# Patient Record
Sex: Female | Born: 1965 | Race: White | Hispanic: No | Marital: Single | Smoking: Former smoker
Health system: Southern US, Community
[De-identification: ages and names within clinical notes are randomized; demographics above are authoritative.]

## PROBLEM LIST (undated history)

## (undated) DIAGNOSIS — I252 Old myocardial infarction: Secondary | ICD-10-CM

## (undated) DIAGNOSIS — E119 Type 2 diabetes mellitus without complications: Secondary | ICD-10-CM

## (undated) DIAGNOSIS — I2510 Atherosclerotic heart disease of native coronary artery without angina pectoris: Secondary | ICD-10-CM

## (undated) HISTORY — PX: CARDIAC SURGERY: SHX584

---

## 2014-12-31 ENCOUNTER — Ambulatory Visit
Admission: EM | Admit: 2014-12-31 | Discharge: 2014-12-31 | Disposition: A | Discharge status: Home / Self Care | Payer: 59 | Attending: Family Medicine | Admitting: Family Medicine

## 2014-12-31 ENCOUNTER — Encounter: Payer: Self-pay | Admitting: Emergency Medicine

## 2014-12-31 DIAGNOSIS — B373 Candidiasis of vulva and vagina: Secondary | ICD-10-CM

## 2014-12-31 DIAGNOSIS — N390 Urinary tract infection, site not specified: Secondary | ICD-10-CM | POA: Diagnosis not present

## 2014-12-31 DIAGNOSIS — B3731 Acute candidiasis of vulva and vagina: Secondary | ICD-10-CM

## 2014-12-31 HISTORY — DX: Atherosclerotic heart disease of native coronary artery without angina pectoris: I25.10

## 2014-12-31 LAB — URINALYSIS COMPLETE WITH MICROSCOPIC (ARMC ONLY): SQUAMOUS EPITHELIAL / LPF: NONE SEEN

## 2014-12-31 MED ORDER — TERCONAZOLE 0.8 % VA CREA: 1.0000 | TOPICAL_CREAM | Freq: Every day | VAGINAL | Status: AC

## 2014-12-31 MED ORDER — FLUCONAZOLE 150 MG PO TABS: 150.0000 mg | ORAL_TABLET | Freq: Every day | ORAL | Status: AC

## 2014-12-31 MED ORDER — KETOCONAZOLE 2 % EX CREA: 1.0000 "application " | TOPICAL_CREAM | Freq: Every day | CUTANEOUS | Status: AC

## 2014-12-31 MED ORDER — SULFAMETHOXAZOLE-TRIMETHOPRIM 800-160 MG PO TABS: 1.0000 | ORAL_TABLET | Freq: Two times a day (BID) | ORAL | Status: AC

## 2014-12-31 NOTE — Discharge Instructions (Signed)

## 2014-12-31 NOTE — ED Notes (Signed)
Patient states she feels like she has a "yeast infection and/or UTI" Has been taking AZO

## 2014-12-31 NOTE — ED Provider Notes (Signed)
CSN: [id number]     Arrival date & time 12/31/14  1050 History   First MD Initiated Contact with Patient 12/31/14 1328     Chief Complaint  Patient presents with  . Urinary Tract Infection   (Consider location/radiation/quality/duration/timing/severity/associated sxs/prior Treatment) HPI   49 year old female diabetic insulin-dependent who presents with symptoms of a UTI and yeast infection. She states that she has not been watching her diet has been drinking and eating to excess with her blood sugars out of control. She has the urgency frequency dysuria and now feels that she is "raw on her bottom"she has frequent occurrences of East infection and is certain that is what this is. She usually has Diflucan ordered and a cream for external use. [facility] but was told they were booked solid and referred her to us..  Past Medical History  Diagnosis Date  . Coronary artery disease    Past Surgical History  Procedure Laterality Date  . Cardiac surgery     History reviewed. No pertinent family history. Social History  Substance Use Topics  . Smoking status: Former Games developermoker  . Smokeless tobacco: None  . Alcohol Use: Yes   OB History    No data available     Review of Systems  Constitutional: Negative for fever, chills and fatigue.  Genitourinary: Positive for dysuria, urgency, frequency, vaginal discharge and vaginal pain.  Skin: Positive for rash.  All other systems reviewed and are negative.   Allergies  Review of patient's allergies indicates not on file.  Home Medications   Prior to Admission medications   Medication Sig Start Date End Date Taking? Authorizing Provider  gabapentin (NEURONTIN) 100 MG capsule Take 100 mg by mouth 3 (three) times daily.   Yes Historical Provider, MD  insulin detemir (LEVEMIR) 100 UNIT/ML injection Inject into the skin at bedtime.   Yes Historical Provider, MD  insulin lispro protamine-lispro (HUMALOG MIX  50/50) (50-50) 100 UNIT/ML SUSP injection Inject into the skin 2 (two) times daily before a meal.   Yes Historical Provider, MD  metFORMIN (GLUCOPHAGE) 1000 MG tablet Take 1,000 mg by mouth 2 (two) times daily with a meal.   Yes Historical Provider, MD  naproxen (NAPROSYN) 250 MG tablet Take by mouth 2 (two) times daily with a meal.   Yes Historical Provider, MD  fluconazole (DIFLUCAN) 150 MG tablet Take 1 tablet (150 mg total) by mouth daily. 12/31/14   Lutricia FeilWilliam P Inari Shin, PA-C  ketoconazole (NIZORAL) 2 % cream Apply 1 application topically daily. 12/31/14   Lutricia FeilWilliam P Analiza Cowger, PA-C  sulfamethoxazole-trimethoprim (BACTRIM DS,SEPTRA DS) 800-160 MG tablet Take 1 tablet by mouth 2 (two) times daily. 12/31/14   Lutricia FeilWilliam P Colston Pyle, PA-C  terconazole (TERAZOL 3) 0.8 % vaginal cream Place 1 applicator vaginally at bedtime. 12/31/14   Lutricia FeilWilliam P Meilah Delrosario, PA-C   Meds Ordered and Administered this Visit  Medications - No data to display  BP 115/73 mmHg  Pulse 100  Temp(Src) 97.3 F (36.3 C) (Tympanic)  Resp 18  Ht 5\' 5"  (1.651 m)  Wt 180 lb (81.647 kg)  BMI 29.95 kg/m2  SpO2 97% No data found.   Physical Exam  Constitutional: She is oriented to person, place, and time. She appears well-developed and well-nourished. No distress.  HENT:  Head: Normocephalic and atraumatic.  Eyes: Conjunctivae are normal. Pupils are equal, round, and reactive to light.  Neck: Normal range of motion. Neck supple.  Pulmonary/Chest: Effort normal and breath  sounds normal. No respiratory distress. She has no wheezes. She has no rales.  Abdominal: Soft. Bowel sounds are normal. She exhibits no distension. There is no tenderness. There is no rebound and no guarding.  No CVA  tenderness is present  Genitourinary:  Pelvic is deferred at patient's request  Musculoskeletal: Normal range of motion. She exhibits no edema or tenderness.  Neurological: She is alert and oriented to person, place, and time.  Skin: Skin is warm and  dry. She is not diaphoretic.  Psychiatric: She has a normal mood and affect. Her behavior is normal. Judgment and thought content normal.  Nursing note and vitals reviewed.   ED Course  Procedures (including critical care time)  Labs Review Labs Reviewed  URINALYSIS COMPLETEWITH MICROSCOPIC ([facility] ONLY) - Abnormal; Notable for the following:    Color, Urine ORANGE (*)    APPearance CLOUDY (*)    Glucose, UA   (*)    Value: TEST NOT REPORTED DUE TO COLOR INTERFERENCE OF URINE PIGMENT   Bilirubin Urine   (*)    Value: TEST NOT REPORTED DUE TO COLOR INTERFERENCE OF URINE PIGMENT   Ketones, ur   (*)    Value: TEST NOT REPORTED DUE TO COLOR INTERFERENCE OF URINE PIGMENT   Hgb urine dipstick   (*)    Value: TEST NOT REPORTED DUE TO COLOR INTERFERENCE OF URINE PIGMENT   Protein, ur   (*)    Value: TEST NOT REPORTED DUE TO COLOR INTERFERENCE OF URINE PIGMENT   Nitrite   (*)    Value: TEST NOT REPORTED DUE TO COLOR INTERFERENCE OF URINE PIGMENT   Leukocytes, UA   (*)    Value: TEST NOT REPORTED DUE TO COLOR INTERFERENCE OF URINE PIGMENT   Bacteria, UA MANY (*)    All other components within normal limits  URINE CULTURE    Imaging Review No results found.   Visual Acuity Review  Right Eye Distance:   Left Eye Distance:   Bilateral Distance:    Right Eye Near:   Left Eye Near:    Bilateral Near:         MDM   1. UTI (lower urinary tract infection)   2. Candidal vaginitis    Discharge Medication List as of 12/31/2014  1:49 PM    START taking these medications   Details  ketoconazole (NIZORAL) 2 % cream Apply 1 application topically daily., Starting 12/31/2014, Until Discontinued, Print    sulfamethoxazole-trimethoprim (BACTRIM DS,SEPTRA DS) 800-160 MG tablet Take 1 tablet by mouth 2 (two) times daily., Starting 12/31/2014, Until Discontinued, Print    terconazole (TERAZOL 3) 0.8 % vaginal cream Place 1 applicator vaginally at bedtime., Starting 12/31/2014, Until  Discontinued, Print      Plan: 1. Test/x-ray results and diagnosis reviewed with patient 2. rx as per orders; risks, benefits, potential side effects reviewed with patient 3. Recommend supportive treatment with fluids and rest. She is to watch her diet more closely. She will call in 48 hours for confirmation of the C&S results. I will treat her empirically for her vaginitis from Candida. Follow-up with her primary care physician if she is not improving. 4. F/u prn if symptoms worsen or don't improve     Lutricia Feil, PA-C 12/31/14 1403

## 2015-01-02 LAB — URINE CULTURE
Culture: >=100000
Special Requests: NORMAL

## 2015-03-05 ENCOUNTER — Ambulatory Visit (INDEPENDENT_AMBULATORY_CARE_PROVIDER_SITE_OTHER): Payer: Worker's Compensation

## 2015-03-05 ENCOUNTER — Encounter: Payer: Self-pay | Admitting: Gynecology

## 2015-03-05 ENCOUNTER — Ambulatory Visit
Admission: EM | Admit: 2015-03-05 | Discharge: 2015-03-05 | Disposition: A | Discharge status: Home / Self Care | Payer: Worker's Compensation | Attending: Family Medicine | Admitting: Family Medicine

## 2015-03-05 DIAGNOSIS — S60221A Contusion of right hand, initial encounter: Secondary | ICD-10-CM

## 2015-03-05 MED ORDER — KETOROLAC TROMETHAMINE 60 MG/2ML IM SOLN
60.0000 mg | Freq: Once | INTRAMUSCULAR | Status: AC
  Administered 2015-03-05: 60 mg via INTRAMUSCULAR

## 2015-03-05 MED ORDER — OXYCODONE-ACETAMINOPHEN 5-325 MG PO TABS: 1.0000 | ORAL_TABLET | Freq: Three times a day (TID) | ORAL | Status: AC | PRN

## 2015-03-05 MED ORDER — MELOXICAM 15 MG PO TABS: 15.0000 mg | ORAL_TABLET | Freq: Every day | ORAL | Status: AC

## 2015-03-05 NOTE — ED Notes (Signed)
Patient stated x today while reaching for a tube at work metal plate fell and crushed on her right hand.

## 2015-03-05 NOTE — ED Provider Notes (Signed)
CSN: [id number]     Arrival date & time 03/05/15  1140 History   First MD Initiated Contact with Patient 03/05/15 1331    Nurses notes were reviewed. Chief Complaint  Patient presents with  . Work Related Injury  patient reports about 2 hours ago having hydraulic machine crushed her right hand. States that [place] 9 on 2 sprays concern. Denies any previous fracture or injury to the right hand before this. She has abrasion right hand last tetanus was about 2 years ago according to her. Past history she's had a history coronary artery disease with heart bypass surgery she used to smoke but stopped after she had myocardial infarction. She is a diabetic as well.    (Consider location/radiation/quality/duration/timing/severity/associated sxs/prior Treatment) Patient is a 50 y.o. female presenting with hand pain. The history is provided by the patient. No language interpreter was used.  Hand Pain This is a new problem. The current episode started 1 to 2 hours ago. The problem occurs constantly. The problem has been gradually worsening. Associated symptoms include chest pain. The symptoms are aggravated by twisting. Nothing relieves the symptoms. She has tried a cold compress for the symptoms. The treatment provided no relief.    Past Medical History  Diagnosis Date  . Coronary artery disease    Past Surgical History  Procedure Laterality Date  . Cardiac surgery     History reviewed. No pertinent family history. Social History  Substance Use Topics  . Smoking status: Former Games developer  . Smokeless tobacco: None  . Alcohol Use: Yes   OB History    No data available     Review of Systems  Cardiovascular: Positive for chest pain.  Skin: Negative for color change.  All other systems reviewed and are negative.   Allergies  Review of patient's allergies indicates no known allergies.  Home Medications   Prior to Admission medications   Medication Sig Start Date End Date Taking?  Authorizing Provider  acetaminophen (TYLENOL) 500 MG tablet Take 500 mg by mouth every 6 (six) hours as needed.   Yes Historical Provider, MD  aspirin 81 MG tablet Take 81 mg by mouth daily.   Yes Historical Provider, MD  cyclobenzaprine (FLEXERIL) 10 MG tablet Take 10 mg by mouth 3 (three) times daily as needed for muscle spasms.   Yes Historical Provider, MD  gabapentin (NEURONTIN) 100 MG capsule Take 100 mg by mouth 3 (three) times daily.   Yes Historical Provider, MD  insulin detemir (LEVEMIR) 100 UNIT/ML injection Inject into the skin at bedtime.   Yes Historical Provider, MD  insulin lispro protamine-lispro (HUMALOG MIX 50/50) (50-50) 100 UNIT/ML SUSP injection Inject into the skin 2 (two) times daily before a meal.   Yes Historical Provider, MD  metFORMIN (GLUCOPHAGE) 1000 MG tablet Take 1,000 mg by mouth 2 (two) times daily with a meal.   Yes Historical Provider, MD  Multiple Vitamin (MULTIVITAMIN) capsule Take 1 capsule by mouth daily.   Yes Historical Provider, MD  naproxen (NAPROSYN) 250 MG tablet Take by mouth 2 (two) times daily with a meal.   Yes Historical Provider, MD  fluconazole (DIFLUCAN) 150 MG tablet Take 1 tablet (150 mg total) by mouth daily. 12/31/14   Lutricia Feil, PA-C  ketoconazole (NIZORAL) 2 % cream Apply 1 application topically daily. 12/31/14   Lutricia Feil, PA-C  meloxicam (MOBIC) 15 MG tablet Take 1 tablet (15 mg total) by mouth daily. 03/05/15   Hassan Rowan, MD  oxyCODONE-acetaminophen (PERCOCET/ROXICET) 5-325 MG  tablet Take 1-2 tablets by mouth every 8 (eight) hours as needed for severe pain. 03/05/15   Hassan Rowan, MD  sulfamethoxazole-trimethoprim (BACTRIM DS,SEPTRA DS) 800-160 MG tablet Take 1 tablet by mouth 2 (two) times daily. 12/31/14   Lutricia Feil, PA-C  terconazole (TERAZOL 3) 0.8 % vaginal cream Place 1 applicator vaginally at bedtime. 12/31/14   Lutricia Feil, PA-C   Meds Ordered and Administered this Visit   Medications  ketorolac  (TORADOL) injection 60 mg (60 mg Intramuscular Given 03/05/15 1346)    BP 153/81 mmHg  Pulse 89  Temp(Src) 97.9 F (36.6 C) (Oral)  Resp 18  Ht  (1.651 m)  Wt 180 lb (81.647 kg)  BMI 29.95 kg/m2  SpO2 100%  LMP  No data found.   Physical Exam  Constitutional: She is oriented to person, place, and time. She appears well-developed and well-nourished.  HENT:  Head: Normocephalic and atraumatic.  Eyes: Conjunctivae are normal. Pupils are equal, round, and reactive to light.  Neck: Normal range of motion. Neck supple. No tracheal deviation present.  Cardiovascular: Normal rate.   Musculoskeletal: She exhibits edema and tenderness.       Right hand: She exhibits decreased range of motion, tenderness, bony tenderness and laceration.       Hands: Patient's inability to make a fist right hand right hand is swollen and warm to touch. Is a small abrasion on the dorsum of the right hand as well.  Neurological: She is alert and oriented to person, place, and time. She has normal reflexes. No cranial nerve deficit.  Skin: Skin is warm. No rash noted. No erythema.  Vitals reviewed.   ED Course  Procedures (including critical care time)  Labs Review Labs Reviewed - No data to display  Imaging Review Dg Hand Complete Right  03/05/2015  CLINICAL DATA:  Pain crawling machine compressed hand causing crush type injury EXAM: RIGHT HAND - COMPLETE 3+ VIEW COMPARISON:  None. FINDINGS: Frontal, oblique, and lateral views were obtained. There is soft tissue swelling. There appears to be air tracking in the soft tissues of the dorsal aspect of the hand. Soft tissue swelling is rather marked in the mid to distal second digit. No fracture or dislocation is evident. The joint spaces appear normal. No erosive change. IMPRESSION: No demonstrable fracture or dislocation. No appreciable joint space narrowing. There is soft tissue swelling somewhat diffusely, most notably in the mid to distal second digit.  There is apparent soft tissue air tracking in the dorsum of the hand consistent with soft tissue injury. No radiopaque foreign body identified. Electronically Signed   By: Bretta Bang III M.D.   On: 03/05/2015 14:00     Visual Acuity Review  Right Eye Distance:   Left Eye Distance:   Bilateral Distance:    Right Eye Near:   Left Eye Near:    Bilateral Near:         MDM   1. Hand contusion, right, initial encounter     Will administer Toradol 60 mg IM to help the pain. X-ray of the right hand as well breakfast continue placing ice on the right hand also. We'll plan for Mobic 15 mg 1 tablet day for 10 pain and narcotic if any fractures present and may have to start conservatively the tramadol Vicodin just because of marked swelling that she's having.  Discussed with patient x-ray of the right hand was negative but did show marked soft tissue swelling. Recommend ice of the  right hand until Saturday as much as she can. Will Allow her to go back to work tomorrow but no use the right hand at all. We'll provide her a right cockup wrist splint, will place on Mobic 15 mg and will give her a prescription for Percocet tablets for pain as well. Informed her to contact  Kela Millin for follow-up early next week.     Hassan Rowan, MD 03/05/15 [phone]

## 2015-03-05 NOTE — Discharge Instructions (Signed)
Contusion °A contusion is a deep bruise. Contusions happen when an injury causes bleeding under the skin. Symptoms of bruising include pain, swelling, and discolored skin. The skin may turn blue, purple, or yellow. °HOME CARE  °· Rest the injured area. °· If told, put ice on the injured area. °¨ Put ice in a plastic bag. °¨ Place a towel between your skin and the bag. °¨ Leave the ice on for 20 minutes, 2-3 times per day. °· If told, put light pressure (compression) on the injured area using an elastic bandage. Make sure the bandage is not too tight. Remove it and put it back on as told by your doctor. °· If possible, raise (elevate) the injured area above the level of your heart while you are sitting or lying down. °· Take over-the-counter and prescription medicines only as told by your doctor. °GET HELP IF: °· Your symptoms do not get better after several days of treatment. °· Your symptoms get worse. °· You have trouble moving the injured area. °GET HELP RIGHT AWAY IF:  °· You have very bad pain. °· You have a loss of feeling (numbness) in a hand or foot. °· Your hand or foot turns pale or cold. °  °This information is not intended to replace advice given to you by your health care provider. Make sure you discuss any questions you have with your health care provider. °  °Document Released: 06/09/2007 Document Revised: 09/11/2014 Document Reviewed: 05/08/2014 °Elsevier Interactive Patient Education ©2016 [employer] ° °Hand Contusion ° A hand contusion is a deep bruise to the hand. Contusions happen when an injury causes bleeding under the skin. Signs of bruising include pain, puffiness (swelling), and discolored skin. The contusion may turn blue, purple, or yellow. °HOME CARE °· Put ice on the injured area. °¨ Put ice in a plastic bag. °¨ Place a towel between your skin and the bag. °¨ Leave the ice on for 15-20 minutes, 03-04 times a day. °· Only take medicines as told by your doctor. °· Use an elastic wrap  only as told. You may remove the wrap for sleeping, showering, and bathing. Take the wrap off if you lose feeling (have numbness) in your fingers, or they turn blue or cold. Put the wrap on more loosely. °· Keep the hand raised (elevated) with pillows. °· Avoid using your hand too much if it is painful. °GET HELP RIGHT AWAY IF:  °· You have more redness, puffiness, or pain in your hand. °· Your puffiness or pain does not get better with medicine. °· You lose feeling in your hand, or you cannot move your fingers. °· Your hand turns cold or blue. °· You have pain when you move your fingers. °· Your hand feels warm. °· Your contusion does not get better in 2 days. °MAKE SURE YOU:  °· Understand these instructions. °· Will watch this condition. °· Will get help right away if you are not doing well or you get worse. °  °This information is not intended to replace advice given to you by your health care provider. Make sure you discuss any questions you have with your health care provider. °  °Document Released: 06/09/2007 Document Revised: 01/11/2014 Document Reviewed: 06/14/2011 °Elsevier Interactive Patient Education ©2016 [employer] ° °

## 2016-03-29 ENCOUNTER — Emergency Department
Admission: EM | Admit: 2016-03-29 | Discharge: 2016-03-29 | Disposition: A | Discharge status: Home / Self Care | Payer: 59 | Attending: Emergency Medicine | Admitting: Emergency Medicine

## 2016-03-29 ENCOUNTER — Encounter: Payer: Self-pay | Admitting: Emergency Medicine

## 2016-03-29 DIAGNOSIS — Z87891 Personal history of nicotine dependence: Secondary | ICD-10-CM | POA: Diagnosis not present

## 2016-03-29 DIAGNOSIS — E162 Hypoglycemia, unspecified: Secondary | ICD-10-CM

## 2016-03-29 DIAGNOSIS — I2510 Atherosclerotic heart disease of native coronary artery without angina pectoris: Secondary | ICD-10-CM | POA: Diagnosis not present

## 2016-03-29 DIAGNOSIS — Z794 Long term (current) use of insulin: Secondary | ICD-10-CM | POA: Insufficient documentation

## 2016-03-29 DIAGNOSIS — R4182 Altered mental status, unspecified: Secondary | ICD-10-CM | POA: Diagnosis present

## 2016-03-29 DIAGNOSIS — Z79899 Other long term (current) drug therapy: Secondary | ICD-10-CM | POA: Insufficient documentation

## 2016-03-29 DIAGNOSIS — Z7982 Long term (current) use of aspirin: Secondary | ICD-10-CM | POA: Insufficient documentation

## 2016-03-29 DIAGNOSIS — E11649 Type 2 diabetes mellitus with hypoglycemia without coma: Secondary | ICD-10-CM | POA: Insufficient documentation

## 2016-03-29 DIAGNOSIS — I252 Old myocardial infarction: Secondary | ICD-10-CM | POA: Insufficient documentation

## 2016-03-29 HISTORY — DX: Old myocardial infarction: I25.2

## 2016-03-29 HISTORY — DX: Type 2 diabetes mellitus without complications: E11.9

## 2016-03-29 LAB — URINALYSIS, COMPLETE (UACMP) WITH MICROSCOPIC
Bacteria, UA: NONE SEEN
Bilirubin Urine: NEGATIVE
Glucose, UA: >=500 mg/dL — AB
Hgb urine dipstick: NEGATIVE
Ketones, ur: NEGATIVE mg/dL
Leukocytes, UA: NEGATIVE
Nitrite: NEGATIVE
Protein, ur: NEGATIVE mg/dL
Specific Gravity, Urine: 1.003 — ABNORMAL LOW (ref 1.005–1.030)
WBC, UA: NONE SEEN WBC/hpf (ref 0–5)
pH: 8 (ref 5.0–8.0)

## 2016-03-29 LAB — BASIC METABOLIC PANEL WITH GFR
Anion gap: 6 (ref 5–15)
BUN: 20 mg/dL (ref 6–20)
CO2: 24 mmol/L (ref 22–32)
Calcium: 9.4 mg/dL (ref 8.9–10.3)
Chloride: 109 mmol/L (ref 101–111)
Creatinine, Ser: 0.83 mg/dL (ref 0.44–1.00)
GFR calc Af Amer: >60 mL/min (ref >60)
GFR calc non Af Amer: >60 mL/min (ref >60)
Glucose, Bld: 267 mg/dL — ABNORMAL HIGH (ref 65–99)
Potassium: 4 mmol/L (ref 3.5–5.1)
Sodium: 139 mmol/L (ref 135–145)

## 2016-03-29 LAB — GLUCOSE, CAPILLARY
Glucose-Capillary: 211 mg/dL — ABNORMAL HIGH (ref 65–99)
Glucose-Capillary: 238 mg/dL — ABNORMAL HIGH (ref 65–99)
Glucose-Capillary: 259 mg/dL — ABNORMAL HIGH (ref 65–99)
Glucose-Capillary: 279 mg/dL — ABNORMAL HIGH (ref 65–99)
Glucose-Capillary: 295 mg/dL — ABNORMAL HIGH (ref 65–99)

## 2016-03-29 LAB — CBC
HEMATOCRIT: 36.1 % (ref 35.0–47.0)
Hemoglobin: 12.4 g/dL (ref 12.0–16.0)
MCH: 32.1 pg (ref 26.0–34.0)
MCHC: 34.4 g/dL (ref 32.0–36.0)
MCV: 93.4 fL (ref 80.0–100.0)
Platelets: 129 10*3/uL — ABNORMAL LOW (ref 150–440)
RBC: 3.87 MIL/uL (ref 3.80–5.20)
RDW: 12.7 % (ref 11.5–14.5)
WBC: 10.7 10*3/uL (ref 3.6–11.0)

## 2016-03-29 NOTE — ED Notes (Signed)
Pt CBG 211. Katie aware of this.

## 2016-03-29 NOTE — ED Triage Notes (Signed)
[facility] hypoglycemic episode. EMS reports pt's CBG was 29 upon [facility] arrival. EMS and fire gave 2 tubes of oral glucose, CBG was 59. EMS reports giving 500ml of D10, pt's CBG 198. Pt reports giving herself normal dose of humalog, 25u. Pt alert/oriented upon arrival, denies any complaints.

## 2016-03-29 NOTE — ED Notes (Signed)
Pt stated she "just ate a hamburger, fries and grapes."

## 2016-03-29 NOTE — ED Provider Notes (Signed)
[facility] Emergency Department Provider Note  ____________________________________________  Time seen: Approximately 4:36 PM  I have reviewed the triage vital signs and the nursing notes.   HISTORY  Chief Complaint Hypoglycemia    HPI Madison Klein is a 51 y.o. female female with a history of diabetes on metformin, humalog with meals, andlevemir presenting w/ hypoglycemia and AMS.  The patient is accompanied by her son, who states that today she was acting "slow." She was standing in one place for long time, asking the same questions over and over. She does not remember being confused. This persisted so her coworkers called EMS and found the patient's blood sugar to be 29. She was given 2 tubes of oral glucose, followed by 500 cc of D10 with improvement in her sugar and her mental status. She has not recently had any illness including cough or cold symptoms, nausea vomiting or diarrhea, nor has she had new change in her medications. She has had a 5 pound weight loss, but has been told to maintain her diabetes medication regimen as is. At this time, the patient is completely asymptomatic.   Past Medical History:  Diagnosis Date  . Coronary artery disease   . Diabetes mellitus without complication (HCC)   . MI, old     There are no active problems to display for this patient.   Past Surgical History:  Procedure Laterality Date  . CARDIAC SURGERY      Current Outpatient Rx  . Order #: [id number] Class: Historical Med  . Order #: [id number] Class: Historical Med  . Order #: [id number] Class: Historical Med  . Order #: [id number] Class: Print  . Order #: [id number] Class: Historical Med  . Order #: [id number] Class: Historical Med  . Order #: [id number] Class: Historical Med  . Order #: [id number] Class: Print  . Order #: [id number] Class: Normal  . Order #: [id number] Class: Historical Med  . Order #: [id number] Class: Historical Med  . Order #: [id number] Class: Historical  Med  . Order #: [id number] Class: Print  . Order #: [id number] Class: Print  . Order #: [id number] Class: Print    Allergies Patient has no known allergies.  No family history on file.  Social History Social History  Substance Use Topics  . Smoking status: Former Games developer  . Smokeless tobacco: Not on file  . Alcohol use Yes    Review of Systems Constitutional: No fever/chills.No lightheadedness or syncope. Positive altered mental status. Eyes: No visual changes. ENT: No sore throat. No congestion or rhinorrhea. Cardiovascular: Denies chest pain. Denies palpitations. Respiratory: Denies shortness of breath.  No cough. Gastrointestinal: No abdominal pain.  No nausea, no vomiting.  No diarrhea.  No constipation. Genitourinary: Negative for dysuria. Musculoskeletal: Negative for back pain. Skin: Negative for rash. Neurological: Negative for headaches. No focal numbness, tingling or weakness.   10-point ROS otherwise negative.  ____________________________________________   PHYSICAL EXAM:  VITAL SIGNS: ED Triage Vitals [03/29/16 1523]  Enc Vitals Group     BP (!) 150/77     Pulse Rate 82     Resp 16     Temp 98.7 F (37.1 C)     Temp Source Oral     SpO2 100 %     Weight 180 lb (81.6 kg)     Height 5\' 6"  (1.676 m)     Head Circumference      Peak Flow      Pain Score      Pain Loc      Pain Edu?  Excl. in GC?     Constitutional: Alert and oriented. Well appearing and in no acute distress. Answers questions appropriately. Eyes: Conjunctivae are normal.  EOMI. No scleral icterus. Head: Atraumatic. Nose: No congestion/rhinnorhea. Mouth/Throat: Mucous membranes are moist.  Neck: No stridor.  Supple.  No meningismus. Cardiovascular: Normal rate, regular rhythm. No murmurs, rubs or gallops.  Respiratory: Normal respiratory effort.  No accessory muscle use or retractions. Lungs CTAB.  No wheezes, rales or ronchi. Gastrointestinal: Overweight. Soft, nontender and  nondistended.  No guarding or rebound.  No peritoneal signs. Musculoskeletal: No LE edema.  Neurologic:  A&Ox3.  Speech is clear.  Face and smile are symmetric.  EOMI.  Moves all extremities well. Skin:  Skin is warm, dry and intact. No rash noted. Psychiatric: Mood and affect are normal. Speech and behavior are normal.  Normal judgement.  ____________________________________________   LABS (all labs ordered are listed, but only abnormal results are displayed)  Labs Reviewed  GLUCOSE, CAPILLARY - Abnormal; Notable for the following:       Result Value   Glucose-Capillary 211 (*)    All other components within normal limits  CBC  BASIC METABOLIC PANEL  URINALYSIS, COMPLETE (UACMP) WITH MICROSCOPIC   ____________________________________________  EKG  ED ECG REPORT I, Rockne MenghiniNorman, Anne-Caroline, the attending physician, personally viewed and interpreted this ECG.   Date: 03/29/2016  EKG Time: 1716  Rate: 84  Rhythm: normal sinus rhythm  Axis: normal  Intervals:none  ST&T Change: no STEMI  ____________________________________________  RADIOLOGY  No results found.  ____________________________________________   PROCEDURES  Procedure(s) performed: None  Procedures  Critical Care performed: No ____________________________________________   INITIAL IMPRESSION / ASSESSMENT AND PLAN / ED COURSE  Pertinent labs & imaging results that were available during my care of the patient were reviewed by me and considered in my medical decision making (see chart for details).  51 y.o. female with a history of diabetes on long-acting, and short acting insulin, as well as metformin, presenting with altered mental status in the setting of hypoglycemia. At this time, the patient's symptoms and blood sugar have completely normalized. She does not have any recent history that would be suggestive of an infectious process for her hypoglycemia. While it has been suggested that she may have  doubled dosed her short acting insulin, she does not think that she did this. She does have a recent 5 pound weight loss, and this may have contributed but again may not be the cause of her hypoglycemia. We'll get basic labs, do serial blood sugar monitoring, rule out UTI, and plan reevaluation for final disposition. If the patient has a reassuring workup and blood sugars remained stable for 3-4 hours in the emergency department, we'll plan discharge home.  Her son will be able to stay with her overnight to watch her closely for any new symptoms.  ----------------------------------------- 7:14 PM on 03/29/2016 -----------------------------------------  The patient has remained hemodynamically stable and was able to eat and drink. She did not have any further hypoglycemic episodes in the emergency department. There is no signs or symptoms of gross abnormalities on her laboratory studies here today, including signs of infection. Plan discharge. Return precautions were discussed.  ____________________________________________  FINAL CLINICAL IMPRESSION(S) / ED DIAGNOSES  Final diagnoses:  Hypoglycemia  Altered mental status, unspecified altered mental status type         NEW MEDICATIONS STARTED DURING THIS VISIT:  New Prescriptions   No medications on file      Rockne MenghiniAnne-Caroline Kelcee Bjorn, MD 03/29/16  1915  

## 2016-03-29 NOTE — ED Notes (Signed)
Pt CBG 279. Madison Klein made aware.

## 2016-03-29 NOTE — Discharge Instructions (Signed)
Please take your nighttime insulin tonight, and your metformin, but do not take your Humalog with breakfast unless your blood sugar is at least 100.  Make an appointment with your primary care physician for re-evaluation and to have your medications rechecked. Return to the emergency department if you develop low blood sugar, changes in meshaky feeling, vomiting,  symptoms concerning to

## 2016-12-18 IMAGING — CR DG HAND COMPLETE 3+V*R*
3 series · 3 of 3 positions shown · non-contrast
Comparison: None.

CLINICAL DATA: Pain crawling machine compressed hand causing crush
type injury

EXAM:
RIGHT HAND - COMPLETE 3+ VIEW

[hand ap]
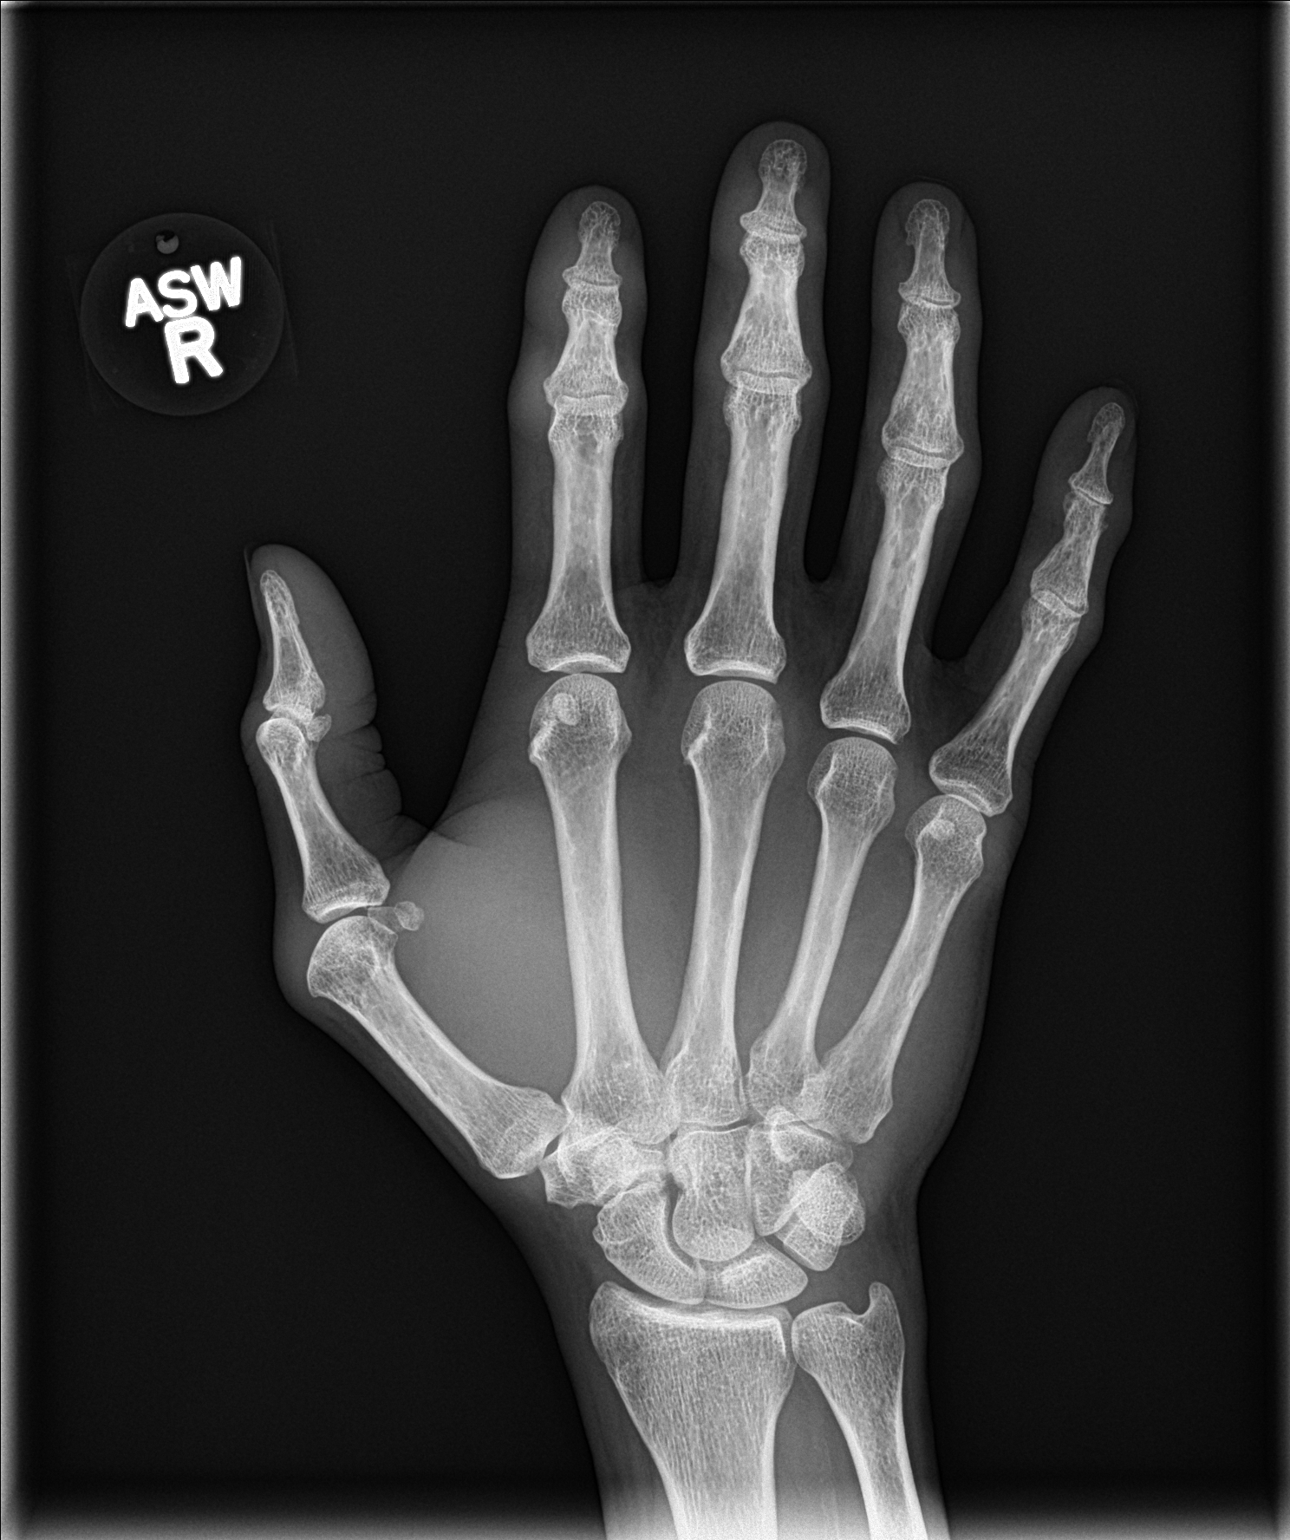

[hand obl]
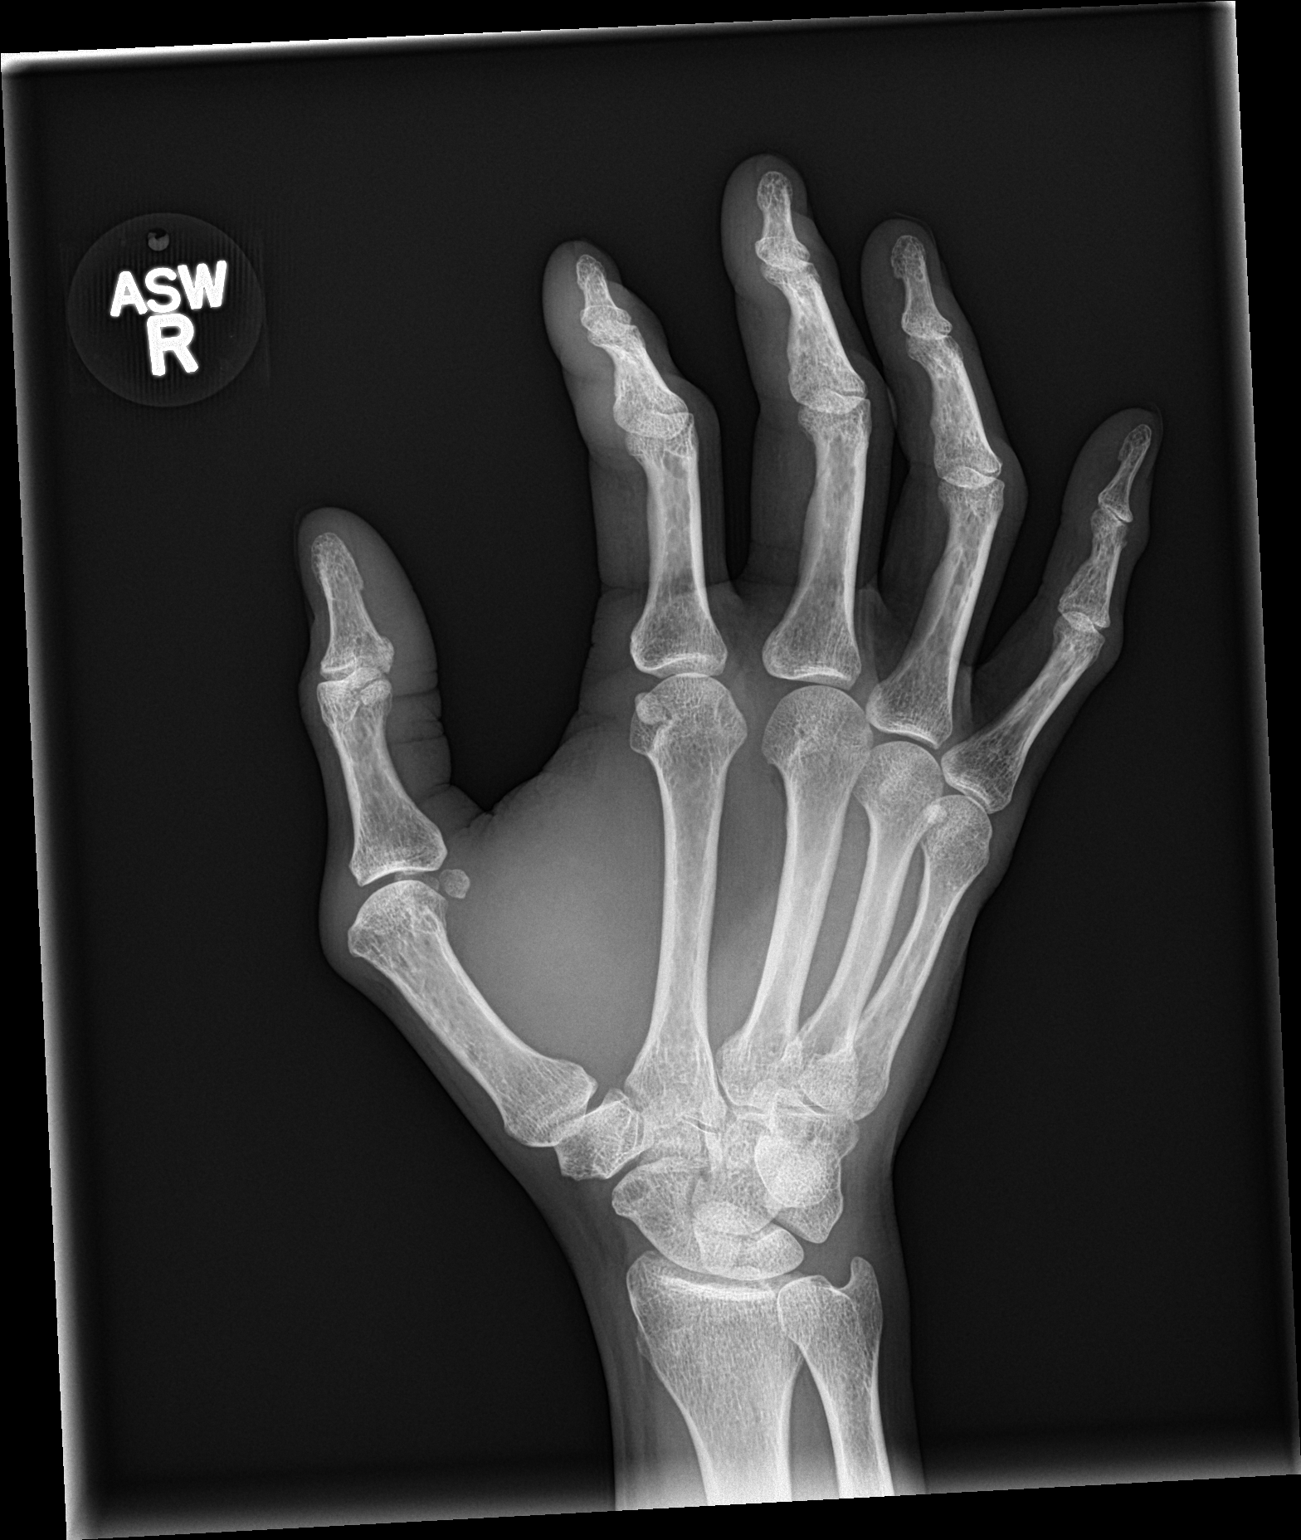

[hand lat]
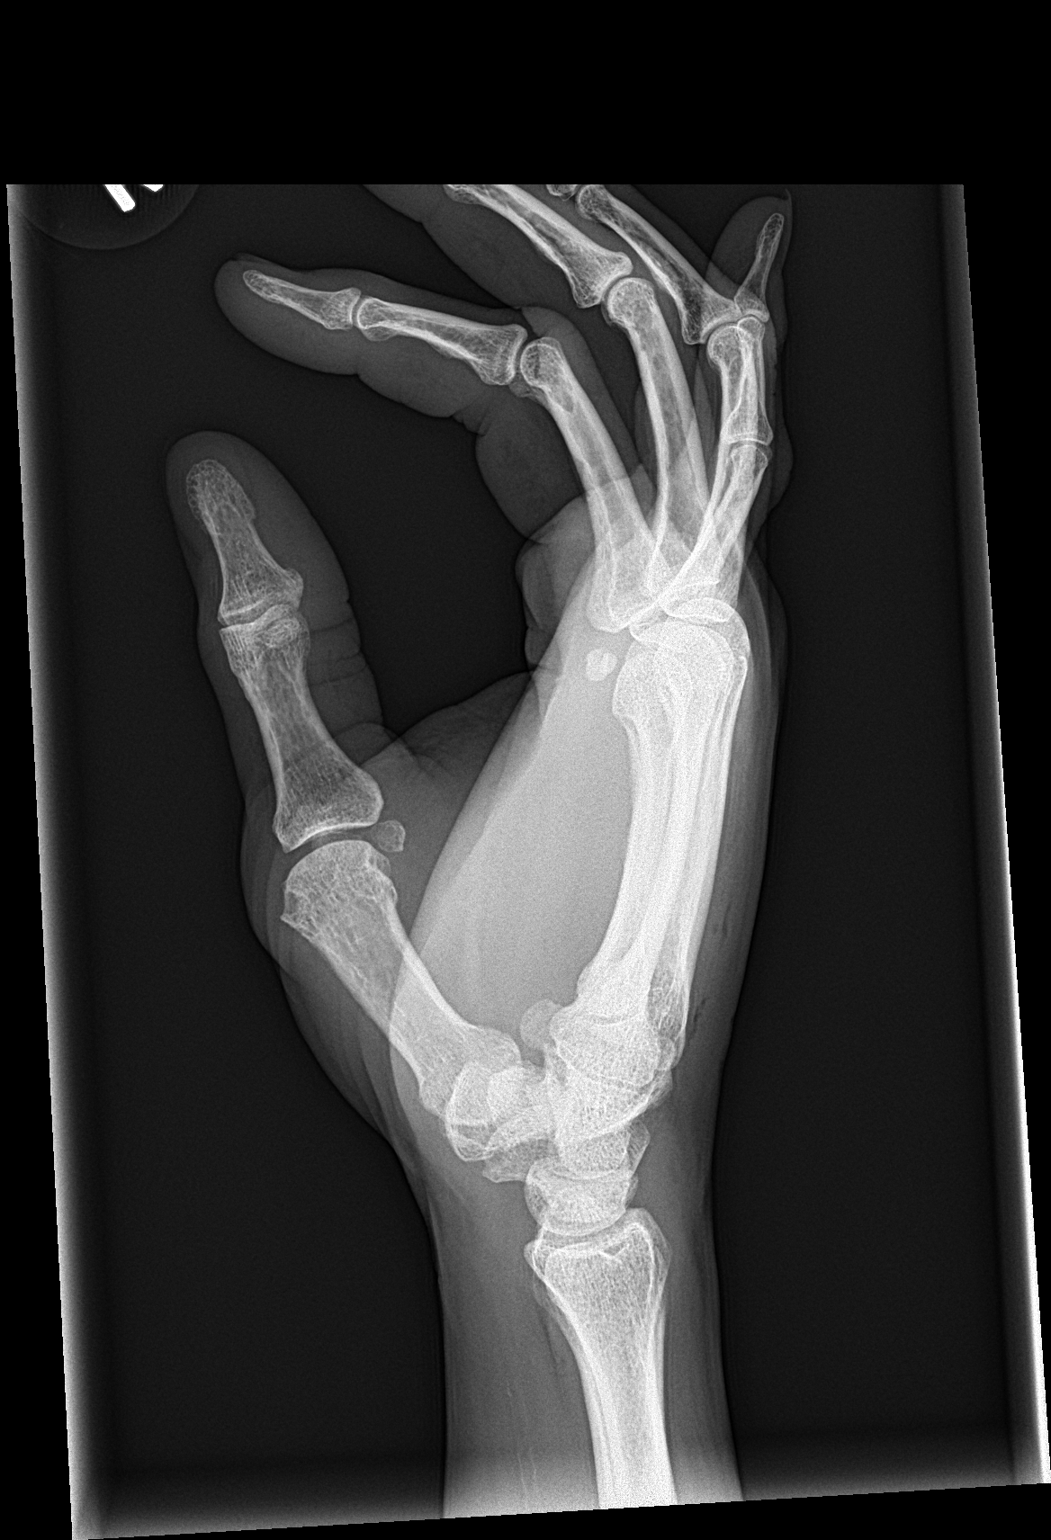

[3 of 3 positions shown; findings below may reference images not displayed]

FINDINGS: Frontal, oblique, and lateral views were obtained. There is soft
tissue swelling. There appears to be air tracking in the soft
tissues of the dorsal aspect of the hand. Soft tissue swelling is
rather marked in the mid to distal second digit.

No fracture or dislocation is evident. The joint spaces appear
normal. No erosive change.
IMPRESSION: No demonstrable fracture or dislocation. No appreciable joint space
narrowing. There is soft tissue swelling somewhat diffusely, most
notably in the mid to distal second digit. There is apparent soft
tissue air tracking in the dorsum of the hand consistent with soft
tissue injury. No radiopaque foreign body identified.

## 2021-10-30 ENCOUNTER — Other Ambulatory Visit: Payer: Self-pay | Admitting: Nurse Practitioner

## 2021-10-30 DIAGNOSIS — Z1231 Encounter for screening mammogram for malignant neoplasm of breast: Secondary | ICD-10-CM
